# Patient Record
Sex: Male | Born: 1965 | Race: White | Hispanic: No | Marital: Married | Smoking: Current every day smoker
Health system: Southern US, Community
[De-identification: ages and names within clinical notes are randomized; demographics above are authoritative.]

## PROBLEM LIST (undated history)

## (undated) HISTORY — PX: NO PAST SURGERIES: SHX2092

---

## 2009-05-04 ENCOUNTER — Ambulatory Visit: Payer: Self-pay | Admitting: Diagnostic Radiology

## 2009-05-04 ENCOUNTER — Emergency Department (HOSPITAL_BASED_OUTPATIENT_CLINIC_OR_DEPARTMENT_OTHER): Admission: EM | Admit: 2009-05-04 | Discharge: 2009-05-04 | Payer: Self-pay | Admitting: Emergency Medicine

## 2017-01-24 ENCOUNTER — Encounter: Payer: Self-pay | Admitting: Physician Assistant

## 2017-01-24 ENCOUNTER — Ambulatory Visit (INDEPENDENT_AMBULATORY_CARE_PROVIDER_SITE_OTHER): Payer: 59 | Admitting: Physician Assistant

## 2017-01-24 VITALS — BP 113/70 | HR 65 | Ht 69.0 in | Wt 165.0 lb

## 2017-01-24 DIAGNOSIS — Z0000 Encounter for general adult medical examination without abnormal findings: Secondary | ICD-10-CM | POA: Diagnosis not present

## 2017-01-24 DIAGNOSIS — F17200 Nicotine dependence, unspecified, uncomplicated: Secondary | ICD-10-CM | POA: Insufficient documentation

## 2017-01-24 DIAGNOSIS — Z23 Encounter for immunization: Secondary | ICD-10-CM

## 2017-01-24 DIAGNOSIS — H9312 Tinnitus, left ear: Secondary | ICD-10-CM | POA: Diagnosis not present

## 2017-01-24 DIAGNOSIS — N529 Male erectile dysfunction, unspecified: Secondary | ICD-10-CM | POA: Insufficient documentation

## 2017-01-24 MED ORDER — SILDENAFIL CITRATE 20 MG PO TABS: ORAL_TABLET | ORAL | 11 refills | Status: DC

## 2017-01-24 NOTE — Progress Notes (Signed)
HPI:                                                                Chad BentWilliam Hickman is a 51 y.o. male who [facility] today for annual physical exam  He is accompanied by his wife today  Current Concerns include erectile dysfunction  Patient reports weaker erections and difficulty maintaining erections to the completion of intercourse. Patient also endorses decreased libido, decreased endurance, and feeling sad/grumpy. He denies nocturia or obstructive symptoms. Denies history of sleep apnea, snoring, or hypersomnolence. Denies family history of prostate cancer.  Health Maintenance Health Maintenance  Topic Date Due  . HIV Screening  10/03/1980  . TETANUS/TDAP  10/03/1984  . COLONOSCOPY  10/04/2015  . INFLUENZA VACCINE  07/27/2017 (Originally 05/30/2016)     Health Habits  Diet: good, no caffeine, 4 servings of dairy/calcium  Exercise: walks x 25mins x 3 days/wk  ETOH: 10 drinks per week  Tobacco: cigarettes 1ppd x 20 years  Drugs: none  No past medical history on file. Past Surgical History:  Procedure Laterality Date  . NO PAST SURGERIES     Social History  Substance Use Topics  . Smoking status: Current Every Day Smoker    Packs/day: 0.50    Years: 25.00    Types: Cigarettes  . Smokeless tobacco: Never Used  . Alcohol use 6.0 oz/week    10 Cans of beer per week   family history includes Cancer in his father; Lung cancer in his father.  ROS: negative except as noted in the HPI  Medications: Current Outpatient Prescriptions  Medication Sig Dispense Refill  . sildenafil (REVATIO) 20 MG tablet Take 1-2 tabs by mouth 1 hour prior to sexual activity 50 tablet 11   No current facility-administered medications for this visit.    No Known Allergies     Objective:  BP 113/70   Pulse 65   Ht 5\' 9"  (1.753 m)   Wt 165 lb (74.8 kg)   BMI 24.37 kg/m  Gen: well-groomed, cooperative, not  ill-appearing, no distress HEENT: normal conjunctiva, TM's clear, poor dentition, oropharynx clear, moist mucus membranes, no thyromegaly or tenderness Pulm: Normal work of breathing, normal phonation, clear to auscultation bilaterally CV: Normal rate, regular rhythm, s1 and s2 distinct, no murmurs, clicks or rubs, no carotid bruit GI: abdomen soft, nondistended, nontender, no masses Neuro: alert and oriented x 3, EOM's intact, PERRLA, DTR's intact, normal tone, no tremor MSK: moving all extremities, normal gait and station, no peripheral edema Skin: warm and dry, no rashes or lesions on exposed skin Psych: normal affect, euthymic mood, normal speech and thought content  [facility] 01/24/2017  Decreased Interest 0  Down, Depressed, Hopeless 0  PHQ - 2 Score 0    Assessment and Plan: 51 y.o. male with   1. Annual physical exam - CBC - Comprehensive metabolic panel - Lipid Panel w/reflex Direct LDL - PSA - Tdap updated today - declines colonoscopy or cologuard. He agrees to hemoccult and is provided with test cards  2. Erectile dysfunction, unspecified erectile dysfunction type - Testosterone Total,Free,Bio, Males - sildenafil (REVATIO) 20 MG tablet; Take 1-2 tabs by mouth 1 hour prior to sexual activity  Dispense: 50 tablet;  Refill: 11  3. Tinnitus of left ear - Ambulatory referral to Audiology  Patient education and anticipatory guidance given Patient agrees with treatment plan Follow-up in 1 year or sooner as needed  Levonne Hubert PA-C

## 2017-01-24 NOTE — Patient Instructions (Addendum)
- Mail [place] your hemoccult cards for colon cancer screening :) - Friendly reminder to have an annual dental exam   Physical Activity Recommendations for modifying lipids and lowering blood pressure Engage in aerobic physical activity to reduce LDL-cholesterol, non-HDL-cholesterol, and blood pressure  Frequency: 3-4 sessions per week  Intensity: moderate to vigorous  Duration: 40 minutes on average  Physical Activity Recommendations for secondary prevention 1. Aerobic exercise  Frequency: 3-5 sessions per week  Intensity: 50-80% capacity  Duration: 20 - 60 minutes  Examples: walking, treadmill, cycling, rowing, stair climbing, and arm/leg ergometry  2. Resistance exercise  Frequency: 2-3 sessions per week  Intensity: 10-15 repetitions/set to moderate fatigue  Duration: 1-3 sets of 8-10 upper and lower body exercises  Examples: calisthenics, elastic bands, cuff/hand weights, dumbbels, free weights, wall pulleys, and weight machines  Heart-Healthy Lifestyle  Eating a diet rich in vegetables, fruits and whole grains: also includes low-fat dairy products, poultry, fish, legumes, and nuts; limit intake of sweets, sugar-sweetened beverages and red meats  Getting regular exercise  Maintaining a healthy weight  Not smoking or getting help quitting  Staying on top of your health; for some people, lifestyle changes alone may not be enough to prevent a heart attack or stroke. In these cases, taking a statin at the right dose will most likely be necessary   Preventive Care 40-64 Years, Male Preventive care refers to lifestyle choices and visits with your health care provider that can promote health and wellness. What does preventive care include?  A yearly physical exam. This is also called an annual well check.  Dental exams once or twice a year.  Routine eye exams. Ask your health care provider how often you should have your eyes checked.  Personal lifestyle choices,  including:  Daily care of your teeth and gums.  Regular physical activity.  Eating a healthy diet.  Avoiding tobacco and drug use.  Limiting alcohol use.  Practicing safe sex.  Taking low-dose aspirin every day starting at age 21. What happens during an annual well check? The services and screenings done by your health care provider during your annual well check will depend on your age, overall health, lifestyle risk factors, and family history of disease. Counseling  Your health care provider may ask you questions about your:  Alcohol use.  Tobacco use.  Drug use.  Emotional well-being.  Home and relationship well-being.  Sexual activity.  Eating habits.  Work and work Statistician. Screening  You may have the following tests or measurements:  Height, weight, and BMI.  Blood pressure.  Lipid and cholesterol levels. These may be checked every 5 years, or more frequently if you are over 87 years old.  Skin check.  Lung cancer screening. You may have this screening every year starting at age 4 if you have a 30-pack-year history of smoking and currently smoke or have quit within the past 15 years.  Fecal occult blood test (FOBT) of the stool. You may have this test every year starting at age 23.  Flexible sigmoidoscopy or colonoscopy. You may have a sigmoidoscopy every 5 years or a colonoscopy every 10 years starting at age 31.  Prostate cancer screening. Recommendations will vary depending on your family history and other risks.  Hepatitis C blood test.  Hepatitis B blood test.  Sexually transmitted disease (STD) testing.  Diabetes screening. This is done by checking your blood sugar (glucose) after you have not eaten for a while (fasting). You may have this done every  1-3 years. Discuss your test results, treatment options, and if necessary, the need for more tests with your health care provider. Vaccines  Your health care provider may recommend certain  vaccines, such as:  Influenza vaccine. This is recommended every year.  Tetanus, diphtheria, and acellular pertussis (Tdap, Td) vaccine. You may need a Td booster every 10 years.  Varicella vaccine. You may need this if you have not been vaccinated.  Zoster vaccine. You may need this after age 41.  Measles, mumps, and rubella (MMR) vaccine. You may need at least one dose of MMR if you were born in 1957 or later. You may also need a second dose.  Pneumococcal 13-valent conjugate (PCV13) vaccine. You may need this if you have certain conditions and have not been vaccinated.  Pneumococcal polysaccharide (PPSV23) vaccine. You may need one or two doses if you smoke cigarettes or if you have certain conditions.  Meningococcal vaccine. You may need this if you have certain conditions.  Hepatitis A vaccine. You may need this if you have certain conditions or if you travel or work in places where you may be exposed to hepatitis A.  Hepatitis B vaccine. You may need this if you have certain conditions or if you travel or work in places where you may be exposed to hepatitis B.  Haemophilus influenzae type b (Hib) vaccine. You may need this if you have certain risk factors. Talk to your health care provider about which screenings and vaccines you need and how often you need them. This information is not intended to replace advice given to you by your health care provider. Make sure you discuss any questions you have with your health care provider. Document Released: 11/12/2015 Document Revised: 07/05/2016 Document Reviewed: 08/17/2015 Elsevier Interactive Patient Education  2017 [place], Adult Preventive dental care includes seeing a dentist regularly and practicing good dental care (oral hygiene) at home. These actions can help to prevent cavities and other tooth problems, root canal problems, gum disease (gingivitis), and tooth loss. Regular dental exams may also  help your health care provider to diagnose other medical problems. Many diseases, including mouth cancers, have early signs that can be found during a preventive dental care visit. What can I expect during my dental visits? Many adults see their dentist one or two times each year for oral exams and cleanings. Talk with your dentist about the best preventive dental care schedule for you. At your visit, your dentist may ask you about:  Your overall health and diet.  Any new symptoms, such as:  Bleeding gums.  Mouth, tooth, or jaw pain. Your dentist will do a mouth (oral) exam to check for:  Cavities.  Gingivitis or other problems.  Signs of cancer.  Neck swelling or lumps.  Abnormal jaw movement or pain in the jaw joint. You may also have:  Dental X-rays.  Your teeth cleaned. If you have an early problem, like a cavity, your dentist will schedule time for you to get treatment. If you have a tooth root problem, gum disease, or a sign of another disease, your dentist may send you to see another health care provider for care. How can I care for my teeth at home?  Brush with an approved fluoride toothpaste every morning and night. If possible, brush within 10 minutes after every meal.  Floss one time every day.  Periodically check your teeth for white or brown spots after brushing. These may be signs of cavities.  Check your gums for swelling or bleeding. These may be signs of gum disease, such as gingivitis or periodontitis.  Make sure your diet includes plenty of fruits, vegetables, milk and dairy products, whole grains, and proteins. Do not eat a lot of starchy foods or foods with added sugar. Talk with your health care provider if you have questions about following a healthy diet.  Avoid sodas, sugary snacks, and sticky candies.  Do not smoke.  Do not get mouth piercings.  If you have tooth or gum pain, gargle with a salt-water mixture 3-4 times per day or as needed. To  make a salt-water mixture, completely dissolve -1 tsp of salt in 1 cup of warm water.  Take over-the counter and prescription medicines only as told by your dentist.  If you have a permanent tooth knocked out:  Find the tooth.  Pick it up by the top (crown) with a tissue or gauze.  Wash the tooth for no more than 10 seconds under cold, running water.  Try to put the tooth back into the gum socket.  Put the tooth in a glass of milk if you cannot get it back in place.  Go to your dentist right away. Take the tooth with you. When should I seek medical care? Call your dentist if you have:  Gum, tooth, or jaw pain.  Red, swollen, or bleeding gums.  A tooth or teeth that are very sensitive to hot or cold.  Very bad breath.  A problem with a filling, crown, implant, or denture.  A broken or loose tooth.  A growth or sore in your mouth that is not going away. Where to find more information: American Dental Association: http://clayton-rivera.info/ This information is not intended to replace advice given to you by your health care provider. Make sure you discuss any questions you have with your health care provider. Document Released: 07/07/2015 Document Revised: 03/23/2016 Document Reviewed: 03/30/2015 Elsevier Interactive Patient Education  2017 [employer].

## 2018-06-27 ENCOUNTER — Other Ambulatory Visit: Payer: Self-pay | Admitting: Physician Assistant

## 2018-06-27 DIAGNOSIS — N529 Male erectile dysfunction, unspecified: Secondary | ICD-10-CM

## 2018-07-03 ENCOUNTER — Telehealth: Payer: Self-pay | Admitting: Physician Assistant

## 2018-07-03 NOTE — Telephone Encounter (Signed)
PT requested "Regular blood work". Mr. Diane will be coming in tomorrow morning to get them done.   Please advise.

## 2018-07-04 ENCOUNTER — Other Ambulatory Visit: Payer: Self-pay | Admitting: Physician Assistant

## 2018-07-04 DIAGNOSIS — Z13220 Encounter for screening for lipoid disorders: Secondary | ICD-10-CM

## 2018-07-04 DIAGNOSIS — Z125 Encounter for screening for malignant neoplasm of prostate: Secondary | ICD-10-CM

## 2018-07-04 DIAGNOSIS — Z130 Encounter for screening for diseases of the blood and blood-forming organs and certain disorders involving the immune mechanism: Secondary | ICD-10-CM

## 2018-07-04 DIAGNOSIS — Z0000 Encounter for general adult medical examination without abnormal findings: Secondary | ICD-10-CM

## 2018-07-04 DIAGNOSIS — Z131 Encounter for screening for diabetes mellitus: Secondary | ICD-10-CM

## 2018-07-04 DIAGNOSIS — N529 Male erectile dysfunction, unspecified: Secondary | ICD-10-CM

## 2018-07-04 NOTE — Telephone Encounter (Signed)
Please advise -EH/RMA  

## 2018-07-04 NOTE — Telephone Encounter (Signed)
Looks like he wanted his testosterone levels checked last year, but never had them drawn If he wants this checked, he will need to go to the lab fasting at 8 am

## 2018-07-10 ENCOUNTER — Ambulatory Visit: Payer: 59 | Admitting: Physician Assistant

## 2018-08-14 ENCOUNTER — Ambulatory Visit (INDEPENDENT_AMBULATORY_CARE_PROVIDER_SITE_OTHER): Payer: 59 | Admitting: Physician Assistant

## 2018-08-14 ENCOUNTER — Encounter: Payer: Self-pay | Admitting: Physician Assistant

## 2018-08-14 DIAGNOSIS — N529 Male erectile dysfunction, unspecified: Secondary | ICD-10-CM

## 2018-08-14 DIAGNOSIS — Z23 Encounter for immunization: Secondary | ICD-10-CM

## 2018-08-14 MED ORDER — SILDENAFIL CITRATE 20 MG PO TABS: ORAL_TABLET | ORAL | 11 refills | Status: DC

## 2018-08-14 NOTE — Progress Notes (Signed)
HPI:                                                                Chad Hickman is a 52 y.o. male who presents to [facility] today for ED follow-up  Erectile Dysfunction  This is a chronic problem. The current episode started more than 1 year ago. The problem has been gradually improving since onset. Irritative symptoms do not include frequency, nocturia or urgency. Obstructive symptoms do not include dribbling, incomplete emptying, an intermittent stream, straining or a weak stream. Pertinent negatives include no inability to urinate. The treatment provided significant relief. He has been using treatment for 1 to 2 years. He has had no adverse reactions caused by medications. Risk factors include tobacco use.    History reviewed. No pertinent past medical history. Past Surgical History:  Procedure Laterality Date  . NO PAST SURGERIES     Social History   Tobacco Use  . Smoking status: Current Every Day Smoker    Packs/day: 1.00    Years: 25.00    Pack years: 25.00    Types: Cigarettes  . Smokeless tobacco: Never Used  Substance Use Topics  . Alcohol use: Yes    Alcohol/week: 10.0 standard drinks    Types: 10 Cans of beer per week   family history includes Cancer in his father; Lung cancer in his father.    ROS: negative except as noted in the HPI  Medications: Current Outpatient Medications  Medication Sig Dispense Refill  . sildenafil (REVATIO) 20 MG tablet Take 1-2 tabs by mouth 1 hour prior to sexual activity 50 tablet 11   No current facility-administered medications for this visit.    No Known Allergies     Objective:  BP 131/84   Pulse 71   Wt 183 lb (83 kg)   BMI 27.02 kg/m  Gen:  alert, not ill-appearing, no distress, appears older than stated age HEENT: head normocephalic without obvious abnormality, conjunctiva and cornea clear, trachea midline Pulm: Normal work of breathing, normal phonation,  clear to auscultation bilaterally, no wheezes, rales or rhonchi CV: Normal rate, regular rhythm, s1 and s2 distinct, no murmurs, clicks or rubs  Neuro: alert and oriented x 3, no tremor MSK: extremities atraumatic, normal gait and station Skin: intact, no rashes on exposed skin, no jaundice, no cyanosis Psych: well-groomed, cooperative, good eye contact, euthymic mood, affect mood-congruent, speech is articulate, and thought processes clear and goal-directed    No results found for this or any previous visit (from the past 72 hour(s)). No results found.    Assessment and Plan: 52 y.o. male with   .Chad Hickman was seen today for medication refill.  Diagnoses and all orders for this visit:  Erectile dysfunction, unspecified erectile dysfunction type -     sildenafil (REVATIO) 20 MG tablet; Take 1-2 tabs by mouth 1 hour prior to sexual activity  Need for immunization against influenza -     Flu Vaccine QUAD 36+ mos IM   He never had his fasting labs completed, including testosterone level He never returned FOBT Encouraged to return for labs and provided with information on Cologuard    Patient education and anticipatory guidance given Patient agrees with treatment plan Follow-up in 6 months  for med mgmt or sooner as needed if symptoms worsen or fail to improve  Levonne Hubert PA-C

## 2018-08-14 NOTE — Patient Instructions (Addendum)
I have also ordered fasting labs. The lab is a walk-in open M-F 7:30a. Try to come in as close to 8 am as possible for your blood work. Nothing to eat or drink after midnight or at least 8 hours before your blood draw. You can have water and your medications.      Erectile Dysfunction Erectile dysfunction (ED) is the inability to get or keep an erection in order to have sexual intercourse. Erectile dysfunction may include:  Inability to get an erection.  Lack of enough hardness of the erection to allow penetration.  Loss of the erection before sex is finished.  What are the causes? This condition may be caused by:  Certain medicines, such as: ? Pain relievers. ? Antihistamines. ? Antidepressants. ? Blood pressure medicines. ? Water pills (diuretics). ? Ulcer medicines. ? Muscle relaxants. ? Drugs.  Excessive drinking.  Psychological causes, such as: ? Anxiety. ? Depression. ? Sadness. ? Exhaustion. ? Performance fear. ? Stress.  Physical causes, such as: ? Artery problems. This may include diabetes, smoking, liver disease, or atherosclerosis. ? High blood pressure. ? Hormonal problems, such as low testosterone. ? Obesity. ? Nerve problems. This may include back or pelvic injuries, diabetes mellitus, multiple sclerosis, or Parkinson disease.  What are the signs or symptoms? Symptoms of this condition include:  Inability to get an erection.  Lack of enough hardness of the erection to allow penetration.  Loss of the erection before sex is finished.  Normal erections at some times, but with frequent unsatisfactory episodes.  Low sexual satisfaction in either partner due to erection problems.  A curved penis occurring with erection. The curve may cause pain or the penis may be too curved to allow for intercourse.  Never having nighttime erections.  How is this diagnosed? This condition is often diagnosed by:  Performing a physical exam to find other diseases  or specific problems with the penis.  Asking you detailed questions about the problem.  Performing blood tests to check for diabetes mellitus or to measure hormone levels.  Performing other tests to check for underlying health conditions.  Performing an ultrasound exam to check for scarring.  Performing a test to check blood flow to the penis.  Doing a sleep study at home to measure nighttime erections.  How is this treated? This condition may be treated by:  Medicine taken by mouth to help you achieve an erection (oral medicine).  Hormone replacement therapy to replace low testosterone levels.  Medicine that is injected into the penis. Your health care provider may instruct you how to give yourself these injections at home.  Vacuum pump. This is a pump with a ring on it. The pump and ring are placed on the penis and used to create pressure that helps the penis become erect.  Penile implant surgery. In this procedure, you may receive: ? An inflatable implant. This consists of cylinders, a pump, and a reservoir. The cylinders can be inflated with a fluid that helps to create an erection, and they can be deflated after intercourse. ? A semi-rigid implant. This consists of two silicone rubber rods. The rods provide some rigidity. They are also flexible, so the penis can both curve downward in its normal position and become straight for sexual intercourse.  Blood vessel surgery, to improve blood flow to the penis. During this procedure, a blood vessel from a different part of the body is placed into the penis to allow blood to flow around (bypass) damaged or blocked  blood vessels.  Lifestyle changes, such as exercising more, losing weight, and quitting smoking.  Follow these instructions at home: Medicines  Take over-the-counter and prescription medicines only as told by your health care provider. Do not increase the dosage without first discussing it with your health care  provider.  If you are using self-injections, perform injections as directed by your health care provider. Make sure to avoid any veins that are on the surface of the penis. After giving an injection, apply pressure to the injection site for 5 minutes. General instructions  Exercise regularly, as directed by your health care provider. Work with your health care provider to lose weight, if needed.  Do not use any products that contain nicotine or tobacco, such as cigarettes and e-cigarettes. If you need help quitting, ask your health care provider.  Before using a vacuum pump, read the instructions that come with the pump and discuss any questions with your health care provider.  Keep all follow-up visits as told by your health care provider. This is important. Contact a health care provider if:  You feel nauseous.  You vomit. Get help right away if:  You are taking oral or injectable medicines and you have an erection that lasts longer than 4 hours. If your health care provider is unavailable, go to the nearest emergency room for evaluation. An erection that lasts much longer than 4 hours can result in permanent damage to your penis.  You have severe pain in your groin or abdomen.  You develop redness or severe swelling of your penis.  You have redness spreading up into your groin or lower abdomen.  You are unable to urinate.  You experience chest pain or a rapid heart beat (palpitations) after taking oral medicines. Summary  Erectile dysfunction (ED) is the inability to get or keep an erection during sexual intercourse. This problem can usually be treated successfully.  This condition is diagnosed based on a physical exam, your symptoms, and tests to determine the cause. Treatment varies depending on the cause, and may include medicines, hormone therapy, surgery, or vacuum pump.  You may need follow-up visits to make sure that you are using your medicines or devices  correctly.  Get help right away if you are taking or injecting medicines and you have an erection that lasts longer than 4 hours. This information is not intended to replace advice given to you by your health care provider. Make sure you discuss any questions you have with your health care provider. Document Released: 10/13/2000 Document Revised: 11/01/2016 Document Reviewed: 11/01/2016 Elsevier Interactive Patient Education  2017 [employer].

## 2020-01-15 ENCOUNTER — Other Ambulatory Visit: Payer: Self-pay | Admitting: Physician Assistant

## 2020-01-15 DIAGNOSIS — N529 Male erectile dysfunction, unspecified: Secondary | ICD-10-CM

## 2021-04-06 ENCOUNTER — Ambulatory Visit (INDEPENDENT_AMBULATORY_CARE_PROVIDER_SITE_OTHER): Payer: 59 | Admitting: Medical-Surgical

## 2021-04-06 DIAGNOSIS — Z5329 Procedure and treatment not carried out because of patient's decision for other reasons: Secondary | ICD-10-CM

## 2021-04-06 DIAGNOSIS — Z0000 Encounter for general adult medical examination without abnormal findings: Secondary | ICD-10-CM

## 2021-04-06 NOTE — Progress Notes (Signed)
   Complete physical exam  Patient: Morgan A Kirkman   DOB: 08/19/2000   55 y.o. Male  MRN: [id number]  Subjective:    No chief complaint on file.   Morgan A Kirkman is a 55 y.o. male who presents today for a complete physical exam. She reports consuming a {diet types:17450} diet. {types:19826} She generally feels {DESC; WELL/FAIRLY WELL/POORLY:18703}. She reports sleeping {DESC; WELL/FAIRLY WELL/POORLY:18703}. She {does/does not:200015} have additional problems to discuss today.    Most recent fall risk assessment:    04/26/2022   10:42 AM  Fall Risk   Falls in the past year? 0  Number falls in past yr: 0  Injury with Fall? 0  Risk for fall due to : No Fall Risks  Follow up Falls evaluation completed     Most recent depression screenings:    04/26/2022   10:42 AM 03/17/2021   10:46 AM  PHQ 2/9 Scores  PHQ - 2 Score 0 0  PHQ- 9 Score 5     {VISON DENTAL STD PSA (Optional):27386}  {History (Optional):23778}  Patient Care Team: Ceasia Elwell, NP as PCP - General (Nurse Practitioner)   Outpatient Medications Prior to Visit  Medication Sig   fluticasone (FLONASE) 50 MCG/ACT nasal spray Place 2 sprays into both nostrils in the morning and at bedtime. After 7 days, reduce to once daily.   norgestimate-ethinyl estradiol (SPRINTEC 28) 0.25-35 MG-MCG tablet Take 1 tablet by mouth daily.   Nystatin POWD Apply liberally to affected area 2 times per day   spironolactone (ALDACTONE) 100 MG tablet Take 1 tablet (100 mg total) by mouth daily.   No facility-administered medications prior to visit.    ROS        Objective:     There were no vitals taken for this visit. {Vitals History (Optional):23777}  Physical Exam   No results found for any visits on 06/01/22. {Show previous labs (optional):23779}    Assessment & Plan:    Routine Health Maintenance and Physical Exam  Immunization History  Administered Date(s) Administered   DTaP 11/02/1999, 12/29/1999,  03/08/2000, 11/22/2000, 06/07/2004   Hepatitis A 04/03/2008, 04/09/2009   Hepatitis B 08/20/1999, 09/27/1999, 03/08/2000   HiB (PRP-OMP) 11/02/1999, 12/29/1999, 03/08/2000, 11/22/2000   IPV 11/02/1999, 12/29/1999, 08/27/2000, 06/07/2004   Influenza,inj,Quad PF,6+ Mos 07/10/2014   Influenza-Unspecified 10/09/2012   MMR 08/27/2001, 06/07/2004   Meningococcal Polysaccharide 04/08/2012   Pneumococcal Conjugate-13 11/22/2000   Pneumococcal-Unspecified 03/08/2000, 05/22/2000   Tdap 04/08/2012   Varicella 08/27/2000, 04/03/2008    Health Maintenance  Topic Date Due   HIV Screening  Never done   Hepatitis C Screening  Never done   INFLUENZA VACCINE  05/30/2022   PAP-Cervical Cytology Screening  06/01/2022 (Originally 08/18/2020)   PAP SMEAR-Modifier  06/01/2022 (Originally 08/18/2020)   TETANUS/TDAP  06/01/2022 (Originally 04/08/2022)   HPV VACCINES  Discontinued   COVID-19 Vaccine  Discontinued    Discussed health benefits of physical activity, and encouraged her to engage in regular exercise appropriate for her age and condition.  Problem List Items Addressed This Visit   None Visit Diagnoses     Annual physical exam    -  Primary   Cervical cancer screening       Need for Tdap vaccination          No follow-ups on file.     Charma Mocarski, NP   

## 2021-09-14 ENCOUNTER — Ambulatory Visit (INDEPENDENT_AMBULATORY_CARE_PROVIDER_SITE_OTHER): Payer: 59 | Admitting: Medical-Surgical

## 2021-09-14 ENCOUNTER — Other Ambulatory Visit: Payer: Self-pay | Admitting: Medical-Surgical

## 2021-09-14 ENCOUNTER — Ambulatory Visit (INDEPENDENT_AMBULATORY_CARE_PROVIDER_SITE_OTHER): Payer: 59

## 2021-09-14 ENCOUNTER — Other Ambulatory Visit: Payer: Self-pay

## 2021-09-14 ENCOUNTER — Encounter: Payer: Self-pay | Admitting: Medical-Surgical

## 2021-09-14 VITALS — BP 153/88 | HR 67 | Resp 20 | Ht 69.0 in | Wt 182.0 lb

## 2021-09-14 DIAGNOSIS — M79661 Pain in right lower leg: Secondary | ICD-10-CM

## 2021-09-14 DIAGNOSIS — Z7689 Persons encountering health services in other specified circumstances: Secondary | ICD-10-CM

## 2021-09-14 DIAGNOSIS — N529 Male erectile dysfunction, unspecified: Secondary | ICD-10-CM

## 2021-09-14 DIAGNOSIS — Z0000 Encounter for general adult medical examination without abnormal findings: Secondary | ICD-10-CM

## 2021-09-14 DIAGNOSIS — Z125 Encounter for screening for malignant neoplasm of prostate: Secondary | ICD-10-CM | POA: Diagnosis not present

## 2021-09-14 DIAGNOSIS — R030 Elevated blood-pressure reading, without diagnosis of hypertension: Secondary | ICD-10-CM

## 2021-09-14 MED ORDER — MELOXICAM 15 MG PO TABS: 15.0000 mg | ORAL_TABLET | Freq: Every day | ORAL | 0 refills | Status: DC

## 2021-09-14 MED ORDER — SILDENAFIL CITRATE 20 MG PO TABS: ORAL_TABLET | ORAL | 11 refills | Status: DC

## 2021-09-14 NOTE — Progress Notes (Signed)
New Patient Office Visit  Subjective:  Patient ID: Chad Hickman, male    DOB: [birth year]  Age: 55 y.o. MRN: [id number]  CC:  Chief Complaint  Patient presents with   Establish Care     HPI Chad Hickman presents to establish care. He is a pleasant 56 year old male who was seen several years ago at this practice by Nelson Chimes, [place]. His last visit here was in 2019 and he is here today to get re-established.   Right shin- Reports playing soccer with his daughter in law about 2 months ago when she missed the ball and ended up kicking him in the right shin. Has been having pain in the area intermittently since then that is described as sharp/stabbing and makes walking painful. No pain today but the area remains a bit tender and swollen.   ED- previously treated for ED with sildenafil. Reports difficulty achieving an erection without medication. Felt the sildenafil worked well for him and he used it on average 2 times weekly.   BP- no history of HTN. BP is elevated today on arrival and on recheck. Not checking BP at home. No sodium restriction and admits to eating fast food often. Denies CP, SOB, palpitations, lower extremity edema, dizziness, headaches, or vision changes.  History reviewed. No pertinent past medical history.  Past Surgical History:  Procedure Laterality Date   NO PAST SURGERIES      Family History  Problem Relation Age of Onset   Cancer Father    Lung cancer Father     Social History   Socioeconomic History   Marital status: Married    Spouse name: Not on file   Number of children: Not on file   Years of education: Not on file   Highest education level: Not on file  Occupational History   Not on file  Tobacco Use   Smoking status: Every Day    Packs/day: 1.00    Years: 25.00    Pack years: 25.00    Types: Cigarettes   Smokeless tobacco: Never  Vaping Use   Vaping Use: Never used  Substance and Sexual Activity   Alcohol use: Yes     Alcohol/week: 10.0 standard drinks    Types: 10 Cans of beer per week   Drug use: No   Sexual activity: Yes    Birth control/protection: Surgical  Other Topics Concern   Not on file  Social History Narrative   Not on file   Social Determinants of Health   Financial Resource Strain: Not on file  Food Insecurity: Not on file  Transportation Needs: Not on file  Physical Activity: Not on file  Stress: Not on file  Social Connections: Not on file  Intimate Partner Violence: Not on file    ROS Review of Systems  Constitutional:  Negative for chills, fatigue, fever and unexpected weight change.  HENT:  Negative for congestion, rhinorrhea, sinus pressure and sore throat.   Eyes:  Negative for visual disturbance.  Respiratory:  Negative for cough, chest tightness, shortness of breath and wheezing.   Cardiovascular:  Negative for chest pain, palpitations and leg swelling.  Gastrointestinal:  Negative for abdominal pain, constipation, diarrhea, nausea and vomiting.  Genitourinary:  Negative for dysuria, frequency and urgency.  Musculoskeletal:  Positive for myalgias (right lower leg).  Skin:  Negative for rash.  Neurological:  Negative for dizziness, light-headedness and headaches.  Psychiatric/Behavioral:  Negative for dysphoric mood, self-injury, sleep disturbance and suicidal ideas. The patient is not nervous/anxious.  Objective:   Today's Vitals: BP (!) 153/88 (BP Location: Right Arm, Patient Position: Sitting, Cuff Size: Large)   Pulse 67   Resp 20   Ht 5\' 9"  (1.753 m)   Wt 182 lb (82.6 kg)   SpO2 98%   BMI 26.88 kg/m   Physical Exam Vitals and nursing note reviewed.  Constitutional:      General: He is not in acute distress.    Appearance: Normal appearance. He is not ill-appearing.  HENT:     Head: Normocephalic and atraumatic.  Cardiovascular:     Rate and Rhythm: Normal rate and regular rhythm.     Pulses: Normal pulses.     Heart sounds: Normal heart sounds.  No murmur heard.   No friction rub. No gallop.  Pulmonary:     Effort: Pulmonary effort is normal. No respiratory distress.     Breath sounds: Normal breath sounds.  Musculoskeletal:        General: Swelling (right lower anterior leg medially) and tenderness present.  Skin:    General: Skin is warm and dry.  Neurological:     Mental Status: He is alert and oriented to person, place, and time.  Psychiatric:        Mood and Affect: Mood normal.        Behavior: Behavior normal.        Thought Content: Thought content normal.        Judgment: Judgment normal.    Assessment & Plan:   1. Encounter to establish care Reviewed available information and discussed care concerns with patient.   2. Erectile dysfunction, unspecified erectile dysfunction type Restart prn sildenafil. Reviewed avoidance of nitrates while taking this medication.  - sildenafil (REVATIO) 20 MG tablet; Take 1-3 tabs by mouth 1 hour prior to sexual activity  Dispense: 50 tablet; Refill: 11  3. Preventative health care Checking labs today.  - Lipid panel - COMPLETE METABOLIC PANEL WITH GFR - CBC with Differential/Platelet  4. Prostate cancer screening Checking PSA.  - PSA  5. Pain in right shin X-ray right shin area. Start Meloxicam 15mg  daily for the next 4 weeks and if no better, follow up with Dr. for further evaluation.   6. Elevated blood pressure reading Reviewed recommendations for sodium restriction. DASH diet information provided with AVS. Plan to return in 2 weeks for a nurse visit for a BP check. If still elevated, advised that we will likely need to start a medication for management along with lifestyle modifications.   Outpatient Encounter Medications as of 09/14/2021  Medication Sig   meloxicam (MOBIC) 15 MG tablet Take 1 tablet (15 mg total) by mouth daily.   sildenafil (REVATIO) 20 MG tablet Take 1-3 tabs by mouth 1 hour prior to sexual activity   [DISCONTINUED] sildenafil (REVATIO) 20 MG  tablet Take 1-2 tabs by mouth 1 hour prior to sexual activity (Patient not taking: Reported on 09/14/2021)   No facility-administered encounter medications on file as of 09/14/2021.    Follow-up: Return in about 2 weeks (around 09/28/2021) for nurse visit for BP check.   09/16/2021, DNP, APRN, FNP-BC [facility] and Sports Medicine

## 2021-09-14 NOTE — Patient Instructions (Signed)

## 2021-09-15 LAB — CBC WITH DIFFERENTIAL/PLATELET
Absolute Monocytes: 672 cells/uL (ref 200–950)
Basophils Absolute: 90 cells/uL (ref 0–200)
Basophils Relative: 1.1 %
Eosinophils Absolute: 189 cells/uL (ref 15–500)
Eosinophils Relative: 2.3 %
HCT: 46.8 % (ref 38.5–50.0)
Hemoglobin: 16.1 g/dL (ref 13.2–17.1)
Lymphs Abs: 1960 cells/uL (ref 850–3900)
MCH: 31.9 pg (ref 27.0–33.0)
MCHC: 34.4 g/dL (ref 32.0–36.0)
MCV: 92.9 fL (ref 80.0–100.0)
MPV: 10.1 fL (ref 7.5–12.5)
Monocytes Relative: 8.2 %
Neutro Abs: 5289 cells/uL (ref 1500–7800)
Neutrophils Relative %: 64.5 %
Platelets: 284 10*3/uL (ref 140–400)
RBC: 5.04 10*6/uL (ref 4.20–5.80)
RDW: 12.4 % (ref 11.0–15.0)
Total Lymphocyte: 23.9 %
WBC: 8.2 10*3/uL (ref 3.8–10.8)

## 2021-09-15 LAB — COMPLETE METABOLIC PANEL WITH GFR
AG Ratio: 1.4 (calc) (ref 1.0–2.5)
ALT: 12 U/L (ref 9–46)
AST: 22 U/L (ref 10–35)
Albumin: 4.6 g/dL (ref 3.6–5.1)
Alkaline phosphatase (APISO): 66 U/L (ref 35–144)
BUN: 11 mg/dL (ref 7–25)
CO2: 29 mmol/L (ref 20–32)
Calcium: 10 mg/dL (ref 8.6–10.3)
Chloride: 105 mmol/L (ref 98–110)
Creat: 0.73 mg/dL (ref 0.70–1.30)
Globulin: 3.2 g/dL (calc) (ref 1.9–3.7)
Glucose, Bld: 86 mg/dL (ref 65–99)
Potassium: 5 mmol/L (ref 3.5–5.3)
Sodium: 142 mmol/L (ref 135–146)
Total Bilirubin: 0.5 mg/dL (ref 0.2–1.2)
Total Protein: 7.8 g/dL (ref 6.1–8.1)
eGFR: 107 mL/min/{1.73_m2} (ref >=60)

## 2021-09-15 LAB — PSA: PSA: 1.24 ng/mL (ref <=4.00)

## 2021-09-15 LAB — LIPID PANEL
Cholesterol: 214 mg/dL — ABNORMAL HIGH (ref <200)
HDL: 95 mg/dL (ref >=40)
LDL Cholesterol (Calc): 101 mg/dL (calc) — ABNORMAL HIGH
Non-HDL Cholesterol (Calc): 119 mg/dL (calc) (ref <130)
Total CHOL/HDL Ratio: 2.3 (calc) (ref <5.0)
Triglycerides: 85 mg/dL (ref <150)

## 2021-09-28 ENCOUNTER — Ambulatory Visit: Payer: 59

## 2022-04-12 ENCOUNTER — Ambulatory Visit: Payer: 59 | Admitting: Family Medicine

## 2023-02-19 ENCOUNTER — Other Ambulatory Visit: Payer: Self-pay | Admitting: Medical-Surgical

## 2023-02-19 DIAGNOSIS — N529 Male erectile dysfunction, unspecified: Secondary | ICD-10-CM

## 2023-03-01 IMAGING — DX DG TIBIA/FIBULA 2V*R*
4 series · 4 of 4 positions shown · non-contrast
Comparison: None.

CLINICAL DATA: Right leg injury, persistent pain

EXAM:
RIGHT TIBIA AND FIBULA - 2 VIEW

[femur ap]
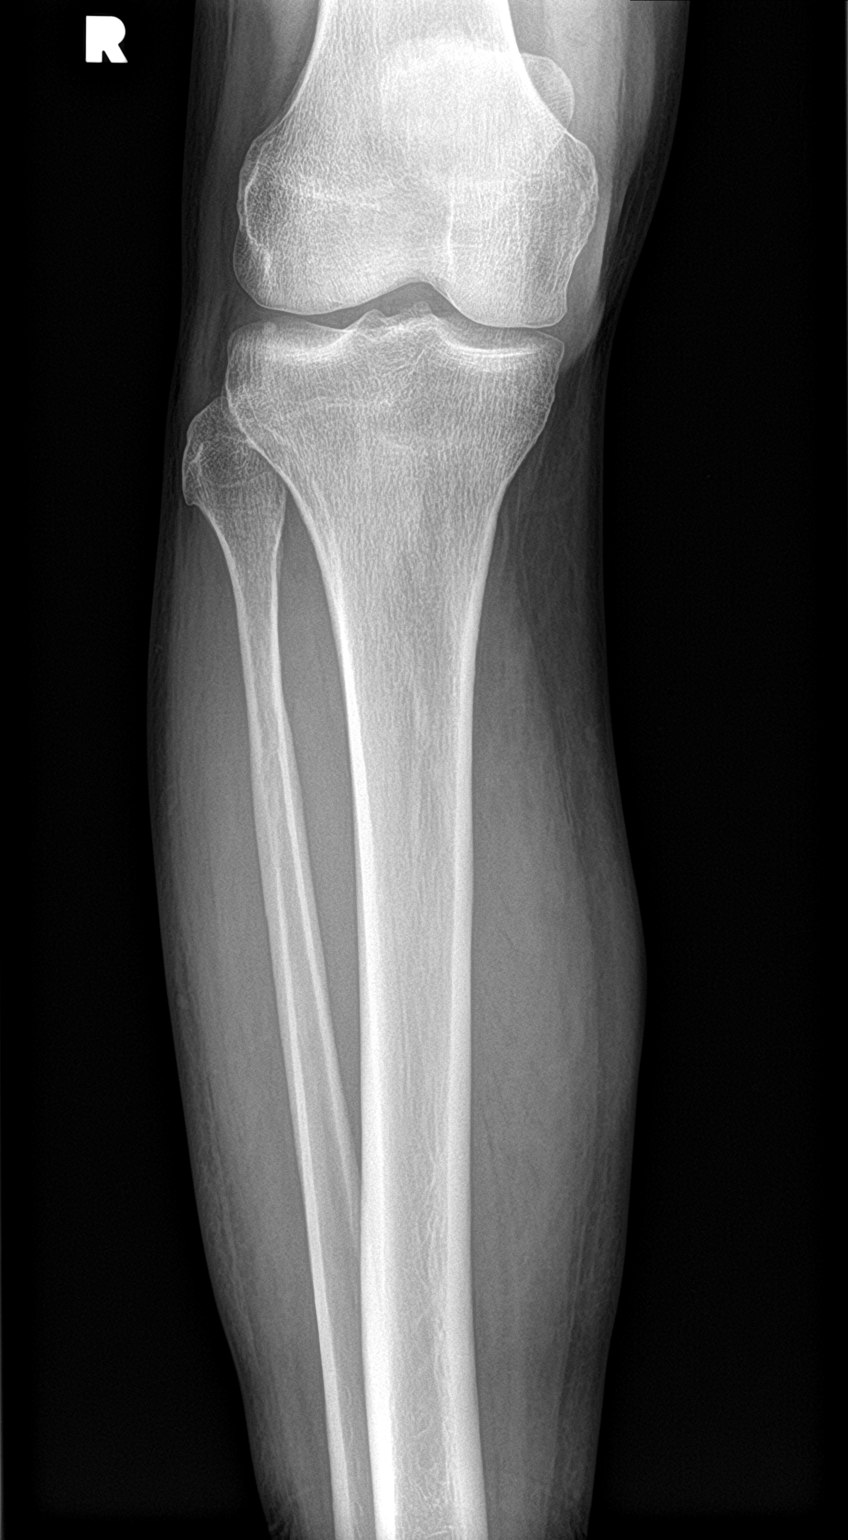

[tibia ap]
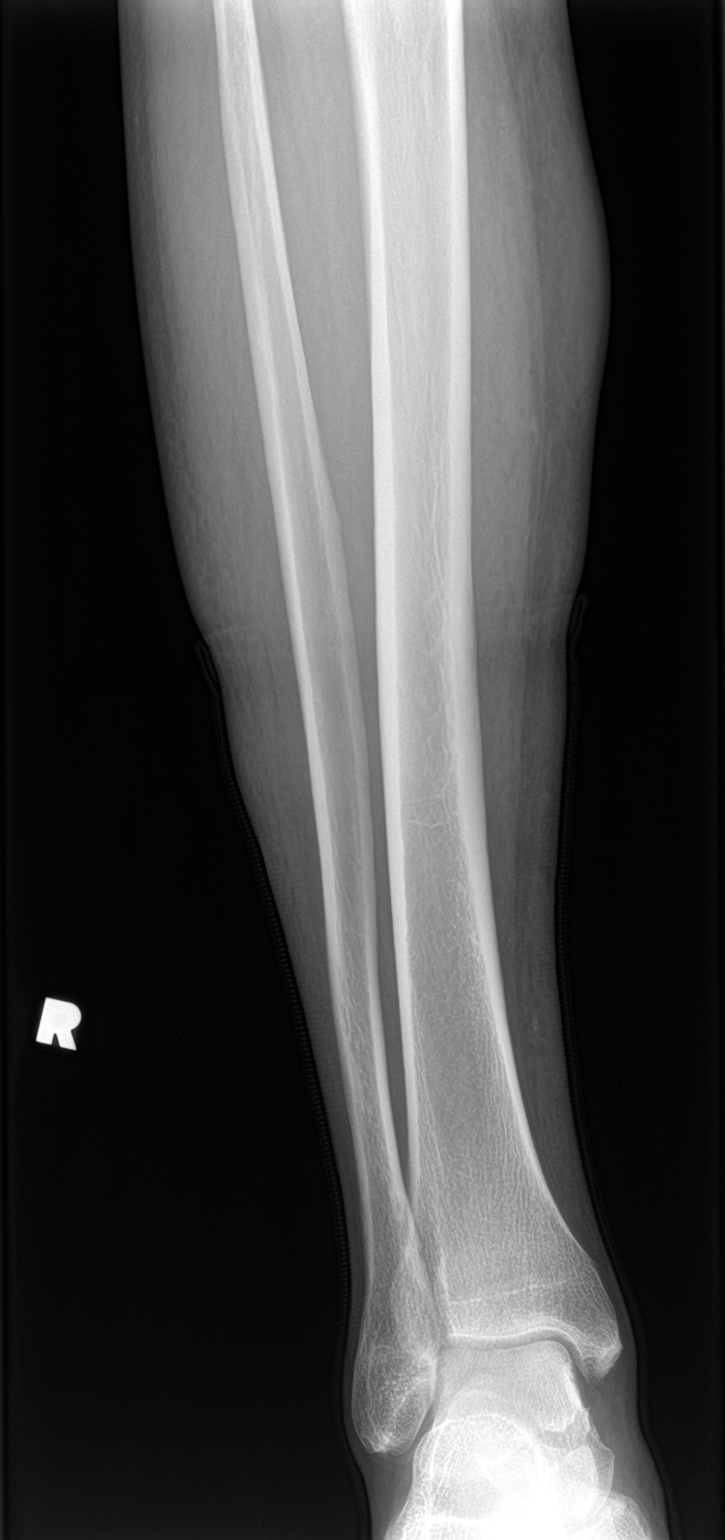

[tibia lat (1 of 2)]
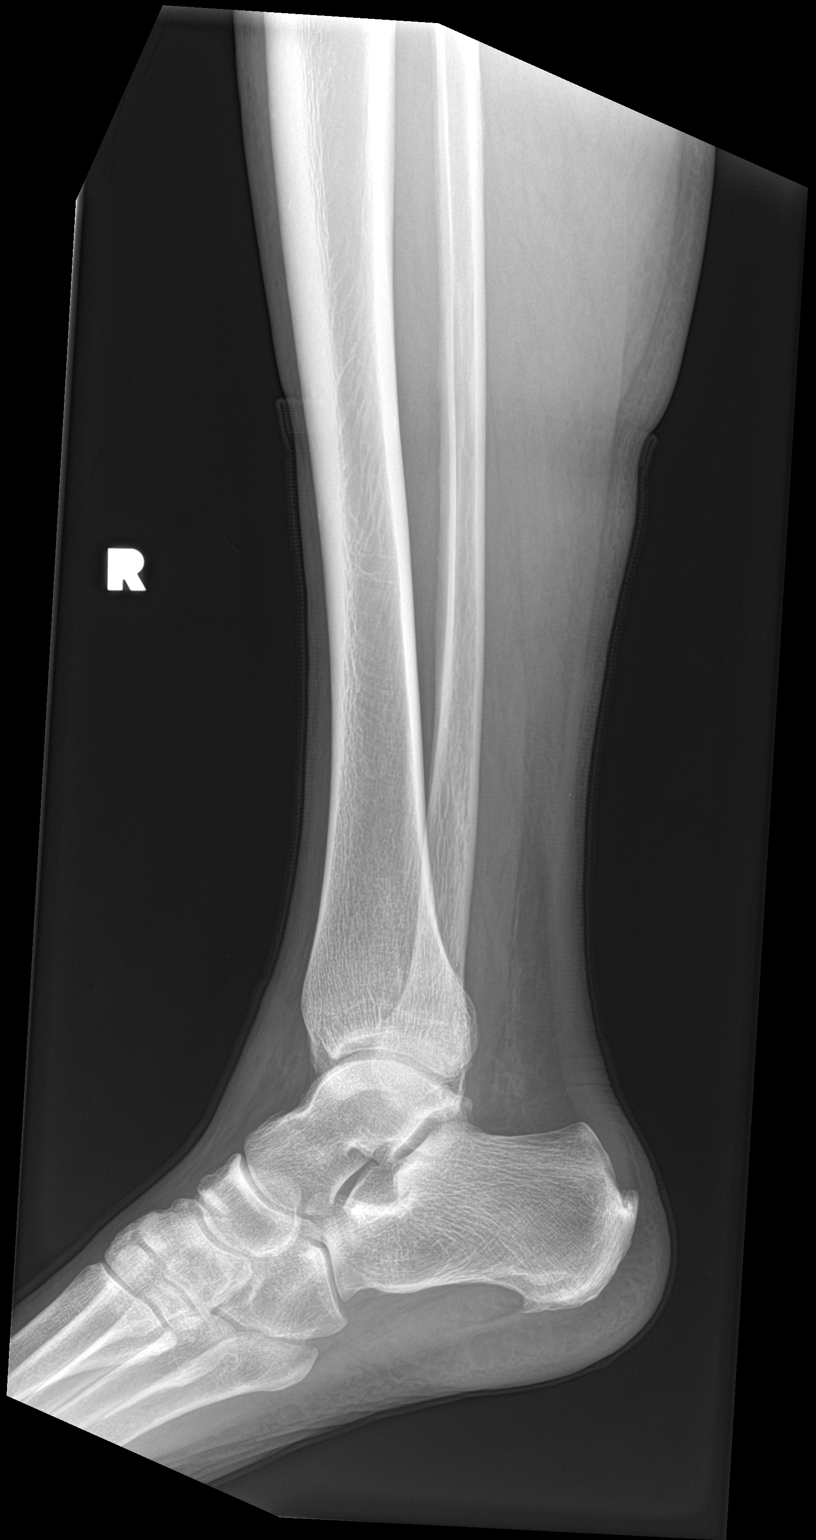

[tibia lat (2 of 2)]
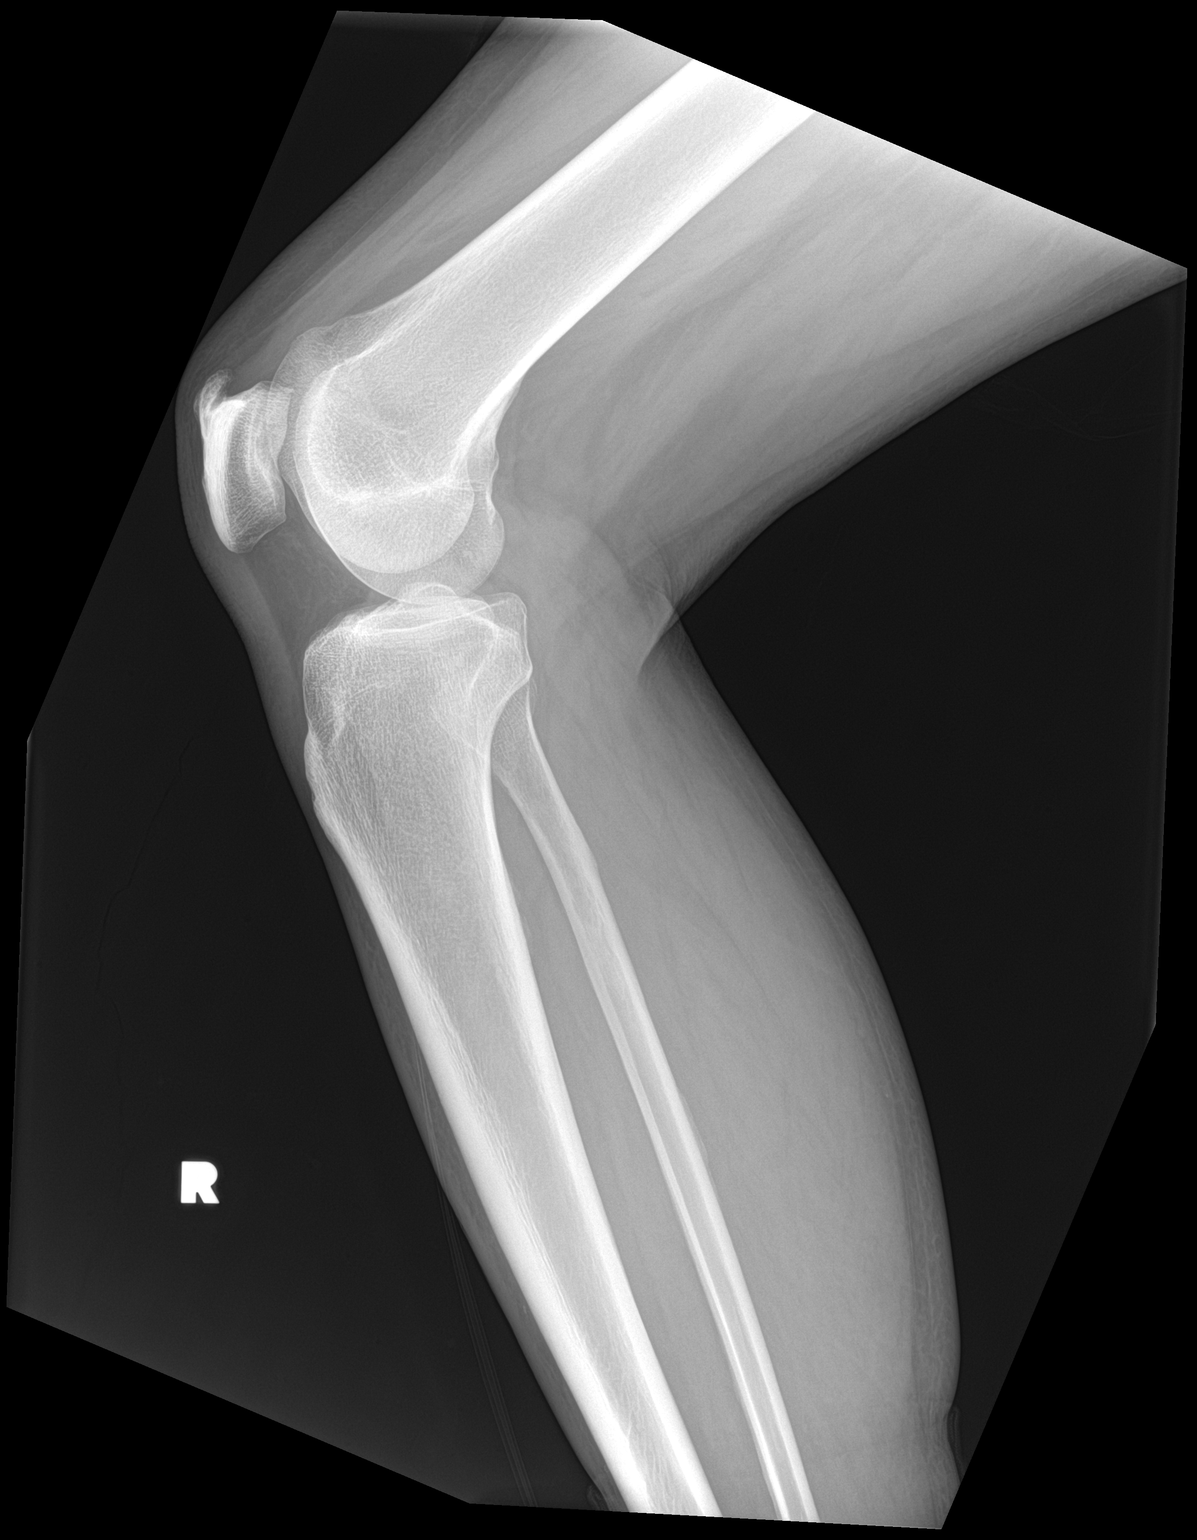

[4 of 4 positions shown; findings below may reference images not displayed]

FINDINGS: Normal alignment. No fracture or dislocation. There is mild
asymmetric subcutaneous edema involving the lateral soft tissues at
the level of the mid diaphysis of the tibia and fibula. No retained
radiopaque foreign body.
IMPRESSION: Mild asymmetric subcutaneous edema laterally. No acute fracture or
dislocation.

## 2023-10-30 ENCOUNTER — Ambulatory Visit: Payer: 59 | Admitting: Medical-Surgical

## 2023-12-05 ENCOUNTER — Encounter: Payer: Self-pay | Admitting: Medical-Surgical

## 2023-12-05 ENCOUNTER — Ambulatory Visit (INDEPENDENT_AMBULATORY_CARE_PROVIDER_SITE_OTHER): Payer: Self-pay | Admitting: Medical-Surgical

## 2023-12-05 VITALS — BP 139/77 | HR 65 | Resp 20 | Ht 69.0 in | Wt 164.1 lb

## 2023-12-05 DIAGNOSIS — Z125 Encounter for screening for malignant neoplasm of prostate: Secondary | ICD-10-CM

## 2023-12-05 DIAGNOSIS — Z122 Encounter for screening for malignant neoplasm of respiratory organs: Secondary | ICD-10-CM

## 2023-12-05 DIAGNOSIS — F17200 Nicotine dependence, unspecified, uncomplicated: Secondary | ICD-10-CM

## 2023-12-05 DIAGNOSIS — N529 Male erectile dysfunction, unspecified: Secondary | ICD-10-CM

## 2023-12-05 DIAGNOSIS — Z0000 Encounter for general adult medical examination without abnormal findings: Secondary | ICD-10-CM

## 2023-12-05 DIAGNOSIS — Z1211 Encounter for screening for malignant neoplasm of colon: Secondary | ICD-10-CM

## 2023-12-05 MED ORDER — SILDENAFIL CITRATE 20 MG PO TABS: ORAL_TABLET | ORAL | 11 refills | Status: AC

## 2023-12-05 NOTE — Progress Notes (Signed)
 Complete physical exam  Patient: Chad Hickman   DOB: May 14, 1966   58 y.o. Male  MRN: [id number]  Subjective:    Chief Complaint  Patient presents with   Annual Exam    Chad Hickman is a 58 y.o. male who presents today for a complete physical exam. He reports consuming a general diet. The patient does not participate in regular exercise at present. He generally feels well. He reports sleeping well. He does not have additional problems to discuss today.    Most recent fall risk assessment:    12/05/2023    8:58 AM  Fall Risk   Falls in the past year? 0  Number falls in past yr: 0  Injury with Fall? 0  Risk for fall due to : No Fall Risks  Follow up Falls evaluation completed     Most recent depression screenings:    12/05/2023    8:58 AM 09/14/2021    9:51 AM  PHQ 2/9 Scores  PHQ - 2 Score 0 0    Vision:Within last year, Dental: No current dental problems and Receives regular dental care, STD: The patient denies history of sexually transmitted disease., and PSA: Prostate cancer screening and PSA options (with potential risks and benefits of testing vs. not testing) were discussed along with recent recs/guidelines.     Patient Care Team: Willo Mini, NP as PCP - General (Nurse Practitioner)   Outpatient Medications Prior to Visit  Medication Sig   [DISCONTINUED] sildenafil  (REVATIO ) 20 MG tablet Take 1-3 tabs by mouth 1 hour prior to sexual activity   [DISCONTINUED] meloxicam  (MOBIC ) 15 MG tablet Take 1 tablet (15 mg total) by mouth daily.   No facility-administered medications prior to visit.    Review of Systems  Constitutional:  Negative for chills, fever, malaise/fatigue and weight loss.  HENT:  Negative for congestion, ear pain, hearing loss, sinus pain and sore throat.   Eyes:  Negative for blurred vision, photophobia and pain.  Respiratory:  Negative for cough, shortness of breath and wheezing.   Cardiovascular:  Negative for chest pain, palpitations and  leg swelling.  Gastrointestinal:  Negative for abdominal pain, constipation, diarrhea, heartburn, nausea and vomiting.  Genitourinary:  Negative for dysuria, frequency and urgency.  Musculoskeletal:  Negative for falls and neck pain.  Skin:  Negative for itching and rash.  Neurological:  Negative for dizziness, weakness and headaches.  Endo/Heme/Allergies:  Negative for polydipsia. Does not bruise/bleed easily.  Psychiatric/Behavioral:  Negative for depression, substance abuse and suicidal ideas. The patient is not nervous/anxious and does not have insomnia.      Objective:    BP (!) 156/73 (BP Location: Left Arm, Cuff Size: Normal)   Pulse 68   Resp 20   Ht 5' 9 (1.753 m)   Wt 164 lb 1.9 oz (74.4 kg)   SpO2 99%   BMI 24.24 kg/m    Physical Exam Constitutional:      General: He is not in acute distress.    Appearance: Normal appearance. He is normal weight. He is not ill-appearing.  HENT:     Head: Normocephalic and atraumatic.     Right Ear: Tympanic membrane, ear canal and external ear normal. There is no impacted cerumen.     Left Ear: Tympanic membrane, ear canal and external ear normal. There is no impacted cerumen.     Nose: Nose normal. No congestion or rhinorrhea.     Mouth/Throat:     Mouth: Mucous membranes are moist.  Pharynx: No oropharyngeal exudate or posterior oropharyngeal erythema.  Eyes:     General: No scleral icterus.       Right eye: No discharge.        Left eye: No discharge.     Extraocular Movements: Extraocular movements intact.     Conjunctiva/sclera: Conjunctivae normal.     Pupils: Pupils are equal, round, and reactive to light.  Neck:     Thyroid: No thyromegaly.     Vascular: No carotid bruit or JVD.     Trachea: Trachea normal.  Cardiovascular:     Rate and Rhythm: Normal rate and regular rhythm.     Pulses: Normal pulses.     Heart sounds: Normal heart sounds. No murmur heard.    No friction rub. No gallop.  Pulmonary:     Effort:  Pulmonary effort is normal. No respiratory distress.     Breath sounds: Normal breath sounds. No wheezing.  Abdominal:     General: Bowel sounds are normal. There is no distension.     Palpations: Abdomen is soft.     Tenderness: There is no abdominal tenderness. There is no guarding.  Musculoskeletal:        General: Normal range of motion.     Cervical back: Normal range of motion and neck supple.  Lymphadenopathy:     Cervical: No cervical adenopathy.  Skin:    General: Skin is warm and dry.  Neurological:     Mental Status: He is alert and oriented to person, place, and time.     Cranial Nerves: No cranial nerve deficit.  Psychiatric:        Mood and Affect: Mood normal.        Behavior: Behavior normal.        Thought Content: Thought content normal.        Judgment: Judgment normal.      No results found for any visits on 12/05/23.     Assessment & Plan:    Routine Health Maintenance and Physical Exam  Immunization History  Administered Date(s) Administered   Influenza,inj,Quad PF,6+ Mos 08/14/2018   Moderna Sars-Covid-2 Vaccination 03/04/2020   Tdap 01/24/2017    Health Maintenance  Topic Date Due   HIV Screening  Never done   Hepatitis C Screening  Never done   Colonoscopy  Never done   Lung Cancer Screening  Never done   COVID-19 Vaccine (2 - 2024-25 season) 12/21/2023 (Originally 07/01/2023)   INFLUENZA VACCINE  01/28/2024 (Originally 05/31/2023)   Zoster Vaccines- Shingrix (1 of 2) 03/03/2024 (Originally 10/04/2015)   Pneumococcal Vaccine 57-11 Years old (1 of 2 - PCV) 12/04/2024 (Originally 10/04/1971)   DTaP/Tdap/Td (2 - Td or Tdap) 01/25/2027   HPV VACCINES  Aged Out    Discussed health benefits of physical activity, and encouraged him to engage in regular exercise appropriate for his age and condition.  1. Annual physical exam (Primary) Checking labs as below.  Up-to-date on vision care but would recommend updating dental care.  Wellness information  provided with AVS. - Lipid panel - CBC with Differential/Platelet - CMP14+EGFR  2. Erectile dysfunction, unspecified erectile dysfunction type History of erectile dysfunction previously treated with sildenafil .  Notes he would take 1 to 2 tablets each time and it worked well for him.  No side effects and no significant concerns.  Continue sildenafil  on a as needed basis. - sildenafil  (REVATIO ) 20 MG tablet; Take 1-3 tabs by mouth 1 hour prior to sexual activity  Dispense: 50  tablet; Refill: 11  3. Tobacco use disorder Discussed smoking history.  He has been smoking approximately 15 cigarettes/day for the last 30 years.  He does qualify for lung cancer screening.  Discussed recommendations for monitoring as well as smoking cessation.  He is not ready to quit but I have advised him that when he is, we have various options that may help with the process.  4. Encounter for screening for lung cancer Ordering lung cancer screening today. - CT CHEST LUNG CA SCREEN LOW DOSE W/O CM; Future  5. Colon cancer screening Discussed options for colon cancer screening.  He is hesitant to complete a colonoscopy but is open to Cologuard.  Cologuard ordered. - Cologuard  6. Prostate cancer screening Discussed risk versus benefits of prostate cancer screening.  He is agreeable so adding PSA to blood work today. - PSA Total (Reflex To Free)  Return in about 2 weeks (around 12/19/2023) for nurse visit for BP check; 1 year for annual physical exam.   Zada Palin, NP

## 2023-12-05 NOTE — Patient Instructions (Signed)

## 2023-12-06 LAB — CBC WITH DIFFERENTIAL/PLATELET
Basophils Absolute: 0.1 10*3/uL (ref 0.0–0.2)
Basos: 1 %
EOS (ABSOLUTE): 0.2 10*3/uL (ref 0.0–0.4)
Eos: 2 %
Hematocrit: 45.5 % (ref 37.5–51.0)
Hemoglobin: 15.7 g/dL (ref 13.0–17.7)
Immature Grans (Abs): 0 10*3/uL (ref 0.0–0.1)
Immature Granulocytes: 0 %
Lymphocytes Absolute: 1.7 10*3/uL (ref 0.7–3.1)
Lymphs: 21 %
MCH: 32.5 pg (ref 26.6–33.0)
MCHC: 34.5 g/dL (ref 31.5–35.7)
MCV: 94 fL (ref 79–97)
Monocytes Absolute: 0.8 10*3/uL (ref 0.1–0.9)
Monocytes: 10 %
Neutrophils Absolute: 5.2 10*3/uL (ref 1.4–7.0)
Neutrophils: 66 %
Platelets: 278 10*3/uL (ref 150–450)
RBC: 4.83 x10E6/uL (ref 4.14–5.80)
RDW: 11.8 % (ref 11.6–15.4)
WBC: 7.9 10*3/uL (ref 3.4–10.8)

## 2023-12-06 LAB — CMP14+EGFR
ALT: 8 [IU]/L (ref 0–44)
AST: 18 [IU]/L (ref 0–40)
Albumin: 4.6 g/dL (ref 3.8–4.9)
Alkaline Phosphatase: 63 [IU]/L (ref 44–121)
BUN/Creatinine Ratio: 15 (ref 9–20)
BUN: 12 mg/dL (ref 6–24)
Bilirubin Total: 0.4 mg/dL (ref 0.0–1.2)
CO2: 22 mmol/L (ref 20–29)
Calcium: 9.6 mg/dL (ref 8.7–10.2)
Chloride: 104 mmol/L (ref 96–106)
Creatinine, Ser: 0.78 mg/dL (ref 0.76–1.27)
Globulin, Total: 2.7 g/dL (ref 1.5–4.5)
Glucose: 93 mg/dL (ref 70–99)
Potassium: 5.1 mmol/L (ref 3.5–5.2)
Sodium: 141 mmol/L (ref 134–144)
Total Protein: 7.3 g/dL (ref 6.0–8.5)
eGFR: 103 mL/min/{1.73_m2} (ref >59)

## 2023-12-06 LAB — LIPID PANEL
Chol/HDL Ratio: 2.1 {ratio} (ref 0.0–5.0)
Cholesterol, Total: 190 mg/dL (ref 100–199)
HDL: 91 mg/dL (ref >39)
LDL Chol Calc (NIH): 90 mg/dL (ref 0–99)
Triglycerides: 44 mg/dL (ref 0–149)
VLDL Cholesterol Cal: 9 mg/dL (ref 5–40)

## 2023-12-06 LAB — PSA TOTAL (REFLEX TO FREE): Prostate Specific Ag, Serum: 1.2 ng/mL (ref 0.0–4.0)

## 2024-02-26 ENCOUNTER — Ambulatory Visit: Payer: Self-pay | Admitting: *Deleted

## 2024-02-26 ENCOUNTER — Ambulatory Visit: Payer: Self-pay | Admitting: Family Medicine

## 2024-02-26 NOTE — Telephone Encounter (Signed)
  Chief Complaint: exposure to covid per patient wife and now has fever 100 Symptoms: fever 100.0 today. Exposed to patient daughter dx with covid on Monday and now has sx  Frequency: last night  Pertinent Negatives: Patient denies chest pain no difficulty breathing  Disposition: [] ED /[] Urgent Care (no appt availability in office) / [x] Appointment(In office/virtual)/ []  [place] [facility]/ [] Home Care/ [] Refused Recommended Disposition /[] [place] [facility]/ []  Follow-up with PCP Additional Notes:   Appt scheduled today . None available with PCP. Please advise if patient needs restrictions coming in office. Patient does not have my chart account for VV.      Copied from CRM [id number]. Topic: Clinical - Red Word Triage >> Feb 26, 2024  8:47 AM Arlie Benedict B wrote: [facility] that prompted transfer to Nurse Triage:  Patient was exposed on Sunday to COVID, running fever this morning right at 100 Reason for Disposition  [1] COVID-19 infection suspected by caller or triager AND [2] mild symptoms (cough, fever, or others) AND [3] negative COVID-19 rapid test  Answer Assessment - Initial Assessment Questions 1. COVID-19 DIAGNOSIS: "How do you know that you have COVID?" (e.g., positive lab test or self-test, diagnosed by doctor or NP/PA, symptoms after exposure).    Has not tested daughter tested positive Monday and patient started sx last night  2. COVID-19 EXPOSURE: "Was there any known exposure to COVID before the symptoms began?" CDC Definition of close contact: within 6 feet (2 meters) for a total of 15 minutes or more over a 24-hour period.      Yes daughter over weekend 3. ONSET: "When did the COVID-19 symptoms start?"      Last night  4. WORST SYMPTOM: "What is your worst symptom?" (e.g., cough, fever, shortness of breath, muscle aches)     Fever  5. COUGH: "Do you have a cough?" If Yes, ask: "How bad is the cough?"       na 6. FEVER: "Do you have a fever?" If Yes, ask: "What is your  temperature, how was it measured, and when did it start?"     10 0 7. RESPIRATORY STATUS: "Describe your breathing?" (e.g., normal; shortness of breath, wheezing, unable to speak)      normal 8. BETTER-SAME-WORSE: "Are you getting better, staying the same or getting worse compared to yesterday?"  If getting worse, ask, "In what way?"     worse 9. OTHER SYMPTOMS: "Do you have any other symptoms?"  (e.g., chills, fatigue, headache, loss of smell or taste, muscle pain, sore throat)     Fever  10. HIGH RISK DISEASE: "Do you have any chronic medical problems?" (e.g., asthma, heart or lung disease, weak immune system, obesity, etc.)       Na  11. VACCINE: "Have you had the COVID-19 vaccine?" If Yes, ask: "Which one, how many shots, when did you get it?"       Yes  12. PREGNANCY: "Is there any chance you are pregnant?" "When was your last menstrual period?"       na 13. O2 SATURATION MONITOR:  "Do you use an oxygen saturation monitor (pulse oximeter) at home?" If Yes, ask "What is your reading (oxygen level) today?" "What is your usual oxygen saturation reading?" (e.g., 95%)       na  Protocols used: Coronavirus (COVID-19) Diagnosed or Suspected-A-AH

## 2024-02-28 ENCOUNTER — Encounter: Payer: Self-pay | Admitting: Medical-Surgical

## 2024-02-28 ENCOUNTER — Ambulatory Visit: Payer: Self-pay | Admitting: Medical-Surgical

## 2024-02-28 VITALS — BP 112/64 | HR 69 | Temp 98.6°F | Resp 20 | Ht 69.0 in | Wt 157.0 lb

## 2024-02-28 DIAGNOSIS — U071 COVID-19: Secondary | ICD-10-CM

## 2024-02-28 LAB — POC COVID19 BINAXNOW: SARS Coronavirus 2 Ag: POSITIVE — AB

## 2024-02-28 LAB — POCT INFLUENZA A/B
Influenza A, POC: NEGATIVE
Influenza B, POC: NEGATIVE

## 2024-02-28 NOTE — Progress Notes (Signed)
        Established patient visit  History, exam, impression, and plan:  1. COVID-19 (Primary) Pleasant 58 year old male accompanied by his wife presents today with complaints of abrupt onset of headache, fever, fatigue, and bodyaches.  He has been exposed to family members who are positive for COVID however his home test on Tuesday and Wednesday were both negative.  Symptoms started on Monday afternoon/evening and he has been out of work since Tuesday.  Occasional shortness of breath worse when laying down.  Mild cough with no wheezing.  No chest pain or GI symptoms.  See below for discal exam.  POCT flu negative but COVID-positive.  Discussed options for oral antivirals however they do not have insurance and this will not be financially feasible.  Reviewed over-the-counter options for symptomatic treatment.  List of upper respiratory illness medications and home remedies provided with AVS.  No issues with hypertension so okay to use over-the-counter cold and flu preparations.  Discussed COVID quarantine recommendations.  Work note provided to allow him to return on Monday next week. - POC COVID-19 - POCT Influenza A/B  Procedures performed this visit: None.  Return if symptoms worsen or fail to improve.  __________________________________ Maryl Snook, DNP, APRN, FNP-BC Primary Care and Sports Medicine [facility]

## 2024-02-28 NOTE — Patient Instructions (Signed)
 Medications & Home Remedies for Upper Respiratory Illness   Aches/Pains, Fever, Headache OTC Acetaminophen (Tylenol) 500 mg tablets - take max 2 tablets (1000 mg) every 6 hours (4 times per day)  OTC Ibuprofen (Motrin) 200 mg tablets - take max 4 tablets (800 mg) every 6 hours*   Sinus Congestion Prescription Atrovent as directed OTC Nasal Saline if desired to rinse OTC Oxymetolazone (Afrin, others) sparing use due to rebound congestion, NEVER use in kids OTC Phenylephrine (Sudafed) 10 mg tablets every 4 hours (or the 12-hour formulation)* OTC Diphenhydramine (Benadryl) 25 mg tablets - take max 2 tablets every 4 hours   Cough & Sore Throat Prescription cough pills or syrups as directed OTC Dextromethorphan (Robitussin, others) - cough suppressant OTC Guaifenesin (Robitussin, Mucinex, others) - expectorant (helps cough up mucus) (Dextromethorphan and Guaifenesin also come in a combination tablet/syrup) OTC Lozenges w/ Benzocaine + Menthol (Cepacol) Honey - as much as you want! Teas which "coat the throat" - look for ingredients Elm Bark, Licorice Root, Marshmallow Root   Other Prescription Oral Steroids to decrease inflammation and improve energy Prescription Antibiotics if these are necessary for bacterial infection - take ALL, even if you're feeling better  OTC Zinc Lozenges within 24 hours of symptoms onset - mixed evidence this shortens the duration of the common cold Don't waste your money on Vitamin C or Echinacea in acute illness - it's already too late!    *Caution in patients with high blood pressure
